# Patient Record
Sex: Female | Born: 1965 | Race: Black or African American | Hispanic: No | Marital: Married | Smoking: Never smoker
Health system: Southern US, Community
[De-identification: ages and names within clinical notes are randomized; demographics above are authoritative.]

## PROBLEM LIST (undated history)

## (undated) DIAGNOSIS — E079 Disorder of thyroid, unspecified: Secondary | ICD-10-CM

---
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
## 1992-10-30 HISTORY — PX: TUBAL LIGATION: SHX77

## 2016-02-15 ENCOUNTER — Ambulatory Visit (INDEPENDENT_AMBULATORY_CARE_PROVIDER_SITE_OTHER): Payer: Self-pay | Admitting: Internal Medicine

## 2016-02-15 ENCOUNTER — Encounter: Payer: Self-pay | Admitting: Internal Medicine

## 2016-02-15 VITALS — BP 128/78 | HR 72 | Resp 18 | Ht 67.0 in | Wt 173.0 lb

## 2016-02-15 DIAGNOSIS — D179 Benign lipomatous neoplasm, unspecified: Secondary | ICD-10-CM

## 2016-02-15 DIAGNOSIS — G5620 Lesion of ulnar nerve, unspecified upper limb: Secondary | ICD-10-CM

## 2016-02-15 DIAGNOSIS — K029 Dental caries, unspecified: Secondary | ICD-10-CM

## 2016-02-15 DIAGNOSIS — Z609 Problem related to social environment, unspecified: Secondary | ICD-10-CM

## 2016-02-15 DIAGNOSIS — Z659 Problem related to unspecified psychosocial circumstances: Secondary | ICD-10-CM

## 2016-02-15 NOTE — Patient Instructions (Addendum)
Can google "advance directives, [place]"  And bring up form from Secretary of [place]. Print and fill out Or can go to "5 wishes"  Which is also in Spanish and fill out--this costs $5--perhaps easier to use. Designate a Forensic scientist to speak for you if you are unable to speak for yourself when ill or injured  Please call clinic and let us know whether you can get hold of the prescription for lenses from last eye appt.  If not, will refer to Optometry and not just for glasses.

## 2016-02-15 NOTE — Progress Notes (Signed)
   Subjective:    Patient ID: Rachel Baxter, female    DOB: 08-19-1966, 50 y.o.   MRN: [id number]  HPI   New patient to establish and ultimately get a CPE  1. Lump in left forearm:  Noted this about 3 years ago.  Seems to get smaller at times.  States had a ganglion cyst more distally in her 66s, but not clear if that resolved on own.   Does get numbness on ulnar or opposite side of bilateral hand and arm--positional and occurs with elbow bent with pressure on elbow.  Can shake out arms and resolves.  No current outpatient prescriptions on file.   Allergies  Allergen Reactions  . Erythromycin Nausea And Vomiting   No past medical history on file.   Past Surgical History  Procedure Laterality Date  . Dilation and curettage of uterus  1985    After delivery of baby--sounds like retained placenta  . Tubal ligation  1994   Social History   Social History  . Marital Status: Married    Spouse Name: Separated  . Number of Children: 2  . Years of Education: 12   Occupational History  . [employer]     [employer]   Social History Main Topics  . Smoking status: Never Smoker   . Smokeless tobacco: Never Used  . Alcohol Use: No  . Drug Use: No  . Sexual Activity: Not Currently   Other Topics Concern  . Not on file   Social History Narrative   Moved to [place] 3 years ago after living in [place] for 17 years.    Originally from [place] with son and grandson.   Daughter's son has lived with her permanently since age 67 yo.  He is now 32 yo.   Daughter is now pregnant and intermittently lives with her.  Daughter does not live a stable life.    Family History  Problem Relation Age of Onset  . Diabetes Mother   . Heart disease Mother     CHF  . Pancreatitis Mother   . Alcohol abuse Mother 68    due to chronic illness of a daughter  . Heart disease Daughter   . Dementia Paternal Grandmother     from head injury      Review of Systems     Objective:    Physical Exam NAD HEENT: Dental decay Lungs:  CTA CV:  RRR without murmur or rub, radial pulses normal and equal. Neuro:  Negative Tinel's over bilateral ulnar nerves at medial epitrochlear area.  No atrophy of hypothenar eminence.  Normal grip, normal sensation of 3rd, 4th, and 5th fingers bilaterally. 3 cm soft tissue mass under skin layer of left distal forearm.  No overlying erythema, no tenderness.  Easily moveable.         Assessment & Plan:  1.  Lipoma, left forearm.  Follow for now.  2.  Intermittent ulnar nerve compression:  Discussed avoidance of leaning of medial elbows.  3.  Dental Decay:  Dental referral  4.  Difficulties in relationship with daughter--referral to Macie Burows, LCSW    Schedule CPE

## 2016-02-16 ENCOUNTER — Telehealth: Payer: Self-pay | Admitting: Internal Medicine

## 2016-02-16 NOTE — Telephone Encounter (Signed)
Patient called to inform Dr. Amil Amen she was able to contact the eye doctor that performed the eye exam last year and was told that they will check the date exam was performed and will let her know if she needs a new exam or if she can just go pick up a pair of glasses. Patient to call back when she hears back from eye doctor

## 2016-02-17 ENCOUNTER — Encounter: Payer: Self-pay | Admitting: Internal Medicine

## 2016-02-22 ENCOUNTER — Telehealth: Payer: Self-pay | Admitting: Internal Medicine

## 2016-02-22 DIAGNOSIS — H547 Unspecified visual loss: Secondary | ICD-10-CM

## 2016-02-22 NOTE — Telephone Encounter (Signed)
Patient called to inform Dr. Amil Amen that she has not been able to reach previous physician who performed eye exam.  Would like for Dr. Amil Amen to place a referral for her to receive an eye exam through [facility].  Patient can be reached at [phone].

## 2016-02-24 NOTE — Telephone Encounter (Signed)
Left voice message to return call 

## 2016-02-25 NOTE — Telephone Encounter (Signed)
Patient referred to eye doctor by Dr. Amil Amen through [place].

## 2016-03-01 ENCOUNTER — Ambulatory Visit (INDEPENDENT_AMBULATORY_CARE_PROVIDER_SITE_OTHER): Payer: Self-pay | Admitting: Licensed Clinical Social Worker

## 2016-03-01 DIAGNOSIS — Z609 Problem related to social environment, unspecified: Secondary | ICD-10-CM

## 2016-03-01 DIAGNOSIS — Z659 Problem related to unspecified psychosocial circumstances: Secondary | ICD-10-CM

## 2016-03-01 NOTE — Telephone Encounter (Signed)
Left voice message to return call 
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
## 2016-03-15 ENCOUNTER — Other Ambulatory Visit: Payer: No Typology Code available for payment source | Admitting: Licensed Clinical Social Worker

## 2016-03-29 ENCOUNTER — Ambulatory Visit (INDEPENDENT_AMBULATORY_CARE_PROVIDER_SITE_OTHER): Payer: Self-pay | Admitting: Licensed Clinical Social Worker

## 2016-03-29 DIAGNOSIS — F4321 Adjustment disorder with depressed mood: Secondary | ICD-10-CM
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
## 2016-04-12 ENCOUNTER — Other Ambulatory Visit: Payer: No Typology Code available for payment source | Admitting: Licensed Clinical Social Worker

## 2016-05-03 ENCOUNTER — Ambulatory Visit (INDEPENDENT_AMBULATORY_CARE_PROVIDER_SITE_OTHER): Payer: Self-pay | Admitting: Licensed Clinical Social Worker

## 2016-05-03 DIAGNOSIS — F418 Other specified anxiety disorders: Secondary | ICD-10-CM
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
## 2016-05-16 ENCOUNTER — Ambulatory Visit (INDEPENDENT_AMBULATORY_CARE_PROVIDER_SITE_OTHER): Payer: Self-pay | Admitting: Internal Medicine

## 2016-05-16 ENCOUNTER — Encounter: Payer: Self-pay | Admitting: Internal Medicine

## 2016-05-16 ENCOUNTER — Ambulatory Visit (INDEPENDENT_AMBULATORY_CARE_PROVIDER_SITE_OTHER): Payer: Self-pay | Admitting: Licensed Clinical Social Worker

## 2016-05-16 VITALS — BP 118/60 | HR 68 | Resp 18 | Ht 67.0 in | Wt 200.0 lb

## 2016-05-16 DIAGNOSIS — I00 Rheumatic fever without heart involvement: Secondary | ICD-10-CM | POA: Insufficient documentation

## 2016-05-16 DIAGNOSIS — Z13220 Encounter for screening for lipoid disorders: Secondary | ICD-10-CM

## 2016-05-16 DIAGNOSIS — M542 Cervicalgia: Secondary | ICD-10-CM

## 2016-05-16 DIAGNOSIS — Z0000 Encounter for general adult medical examination without abnormal findings: Secondary | ICD-10-CM

## 2016-05-16 DIAGNOSIS — F418 Other specified anxiety disorders: Secondary | ICD-10-CM

## 2016-05-16 DIAGNOSIS — Z23 Encounter for immunization: Secondary | ICD-10-CM

## 2016-05-16 DIAGNOSIS — Z1239 Encounter for other screening for malignant neoplasm of breast: Secondary | ICD-10-CM

## 2016-05-16 DIAGNOSIS — A5901 Trichomonal vulvovaginitis: Secondary | ICD-10-CM

## 2016-05-16 DIAGNOSIS — Z124 Encounter for screening for malignant neoplasm of cervix: Secondary | ICD-10-CM

## 2016-05-16 DIAGNOSIS — E042 Nontoxic multinodular goiter: Secondary | ICD-10-CM

## 2016-05-16 LAB — POCT WET PREP WITH KOH
Clue Cells Wet Prep HPF POC: NEGATIVE
KOH Prep POC: NEGATIVE
RBC WET PREP PER HPF POC: NEGATIVE
Trichomonas, UA: POSITIVE
Yeast Wet Prep HPF POC: NEGATIVE

## 2016-05-16 MED ORDER — METRONIDAZOLE 500 MG PO TABS: ORAL_TABLET | ORAL | Status: DC

## 2016-05-16 MED ORDER — CYCLOBENZAPRINE HCL 5 MG PO TABS: ORAL_TABLET | ORAL | Status: DC

## 2016-05-16 NOTE — Progress Notes (Signed)
Subjective:    Patient ID: Rachel Baxter, female    DOB: 1966-04-26, 50 y.o.   MRN: [id number]  HPI   Here for CPE with Pap  1.  Last Pap:  More than 5 years ago in [place], [place].  Always normal.  2.  Last Mammogram:  In [place], [place].   States she was in her 86s.  Had some sort of skin lesion on her left breast.    3.  SBE:  Not consistent.  Discussed checking 1-2 weeks after period.  4.  Guaiac Cards:  Never.    5.  Colonoscopy:  Never  6.  Immunizations:  No tetanus in past 10 years.   Never had flu vaccine.   Current outpatient prescriptions:  .  vitamin B-12 (CYANOCOBALAMIN) 500 MCG tablet, Take 500 mcg by mouth daily., Disp: , Rfl:    Allergies  Allergen Reactions  . Erythromycin Nausea And Vomiting   No past medical history on file.   Past Surgical History  Procedure Laterality Date  . Dilation and curettage of uterus  1985    After delivery of baby--sounds like retained placenta  . Tubal ligation  1994   Family History  Problem Relation Age of Onset  . Diabetes Mother   . Heart disease Mother     CHF  . Pancreatitis Mother   . Alcohol abuse Mother 70    due to chronic illness of a daughter  . Heart disease Daughter   . Dementia Paternal Grandmother     from head injury     Review of Systems  Constitutional: Negative for appetite change.       Does not like the heat.  "sucks her energy away"  HENT: Negative for trouble swallowing.        When went to dentist last week.  10 cavities:  2 filled.  To have cleaning next week and will have other cavities addressed thereafter.  Eyes: Positive for visual disturbance.       Has not received notice of eye referral yet.  Near vision is blurry.  Current glasses not adequate  Respiratory: Negative for cough and shortness of breath.   Cardiovascular: Positive for leg swelling. Negative for chest pain and palpitations.       Left ankle swells when standing on concrete floor at work.  Gastrointestinal: Negative for  nausea, vomiting, abdominal pain, diarrhea, constipation and blood in stool.       No melena  Endocrine: Negative for polydipsia and polyuria.  Genitourinary: Negative for dysuria, hematuria, vaginal discharge and menstrual problem.       No history of STD  Musculoskeletal: Positive for neck pain.       Right posterior neck pain.  Felt her neck pop in this area a 5-6 days ago.  Has had some pain in neck and headache intermittently since then.  Was just sitting in her car before work when she turned her head and felt the pop. No numbness, tingling, or weakness of her right arm.   Has had neck popping before with neck pain, but not with headache.   Has taken Tylenol, which helps a little.   Neurological: Positive for headaches. Negative for tremors, weakness and numbness.  Hematological: Negative for adenopathy. Does not bruise/bleed easily.  Psychiatric/Behavioral: Negative for suicidal ideas and dysphoric mood. The patient is not nervous/anxious.        Objective:   Physical Exam  Constitutional: She is oriented to person, place, and time. She appears  well-developed and well-nourished.  HENT:  Head: Normocephalic and atraumatic.  Right Ear: Hearing, tympanic membrane, external ear and ear canal normal.  Left Ear: Hearing, tympanic membrane, external ear and ear canal normal.  Nose: Nose normal.  Mouth/Throat: Uvula is midline, oropharynx is clear and moist and mucous membranes are normal. Dental caries present.  Eyes: Conjunctivae and EOM are normal. Pupils are equal, round, and reactive to light.  Discs sharp bilaterally  Neck: Normal range of motion. Neck supple. Muscular tenderness present. Thyromegaly present.  Large nodule palpated on left lower thyroid lobe and smaller nodule on right lower lobe  Bilateral traps with tenderness out to shoulder and less so up to nuchal ridge.  Also tender down to medial scapula.  Left trap with mild swelling at medial aspect halfway to shoulder.    Cardiovascular: Normal rate, regular rhythm, S1 normal and S2 normal.  Exam reveals no S3, no S4 and no friction rub.   No murmur heard. No carotid bruit.  Carotid, radial, femoral, DP and PT pulses normal and equal.  Pulmonary/Chest: Effort normal and breath sounds normal. Right breast exhibits no inverted nipple, no mass, no nipple discharge and no tenderness. Left breast exhibits no inverted nipple, no mass, no nipple discharge and no tenderness.  Mild lumpiness without discreet mass in upper outer quadrants of bilateral breasts  Abdominal: Soft. Bowel sounds are normal. She exhibits no mass. There is no hepatosplenomegaly. There is no tenderness. No hernia.  Genitourinary: Uterus normal. Rectal exam shows no mass and no tenderness. Guaiac negative stool. There is no rash, tenderness or lesion on the right labia. There is no rash, tenderness or lesion on the left labia. Cervix exhibits discharge and friability. Cervix exhibits no motion tenderness. Right adnexum displays no mass and no tenderness. Left adnexum displays no mass and no tenderness.  Frothy beige vaginal discharge with fishy odor-copious.  Cervix with mild friability.  Musculoskeletal: Normal range of motion.  Lymphadenopathy:    She has no cervical adenopathy.    She has no axillary adenopathy.       Right: No inguinal adenopathy present.       Left: No inguinal adenopathy present.  Neurological: She is alert and oriented to person, place, and time. She has normal strength and normal reflexes. No cranial nerve deficit or sensory deficit. Coordination and gait normal.  Skin: Skin is warm and dry. No lesion and no rash noted. Nails show no clubbing.  Psychiatric: She has a normal mood and affect. Her speech is normal and behavior is normal. Judgment and thought content normal.          Assessment & Plan:  1.  CPE with pap Tdap today. Fasting labs:  FLP, CBC, CMP.  Additional labs below Mammogram through  scholarship. Guaiac Cards x 3 to return in 2 weeks.  2.  Multinodular Goiter:  Suspect will require FNA.  Check Thyroid ultrasound, TSH  3.  Trichomonas Vaginitis:  GC/Chlamydia swab,RPR,  HIV as well.  Metronidazole 2,000 mg one dose.  Boyfriend to be evaluated and treated and they need discuss their relationship.  4.  Muscle spasm, bilateral trapezius:  Warm pack with stretches and Ibuprofen as needed.  To call if no improvement in next 1-2 weeks and will refer to PT.

## 2016-05-16 NOTE — Progress Notes (Signed)
   THERAPY PROGRESS NOTE  Session Time: 12min  Participation Level: Active  Behavioral Response: Neat and Well GroomedAlertEuthymic  Type of Therapy: Individual Therapy  Treatment Goals addressed: Coping  Interventions: Supportive  Summary: Rachel Baxter is a 50 y.o. female who presents with a positive mood and appropriate affect. She reported that she has a new grandson who was born yesterday. She expressed her happiness and excitement, despite her difficult relationship with her daughter. She stated that she "will not be raising that baby." [name] reported that she also has a new puppy, which brings its own stressors and joys. She shared about some of her relationships with co-workers. She reported that she still has not been hired on as full-time, despite having finished her 20 day probation period. She shared her frustration, as she is now working for less money than most of her co-workers. [name] reported that she had had just one panic attack in the past two weeks, possibly triggered by extreme heat. She shared that getting overheated also makes her angry and irritated.    Suicidal/Homicidal: Nowithout intent/plan  Therapist Response: LCSW utilized supportive counseling techniques throughout the session in order to validate emotions and encourage open expression of emotion. LCSW and [name] processed about her current stressors.  Plan: Return again in 3 weeks.  Diagnosis: Axis I: See current hospital problem list    Axis II: No diagnosis    Metta Clines, LCSW 05/16/2016

## 2016-05-16 NOTE — Patient Instructions (Addendum)
Call if vaginal discharge does not resolve  Ibuprofen 200 mg 2-4 tabs twice daily for neck pain with food for next 2 weeks, then only as needed thereafter.  Warm pack neck and stretch twice daily  Ames walker thigh high compression stockings will be least expensive.

## 2016-05-17 LAB — COMPREHENSIVE METABOLIC PANEL
A/G RATIO: 1.3 (ref 1.2–2.2)
ALK PHOS: 51 IU/L (ref 39–117)
ALT: 20 IU/L (ref 0–32)
AST: 17 IU/L (ref 0–40)
Albumin: 4 g/dL (ref 3.5–5.5)
BUN/Creatinine Ratio: 13 (ref 9–23)
BUN: 10 mg/dL (ref 6–24)
Bilirubin Total: 0.9 mg/dL (ref 0.0–1.2)
CO2: 29 mmol/L (ref 18–29)
Calcium: 9 mg/dL (ref 8.7–10.2)
Chloride: 104 mmol/L (ref 96–106)
Creatinine, Ser: 0.78 mg/dL (ref 0.57–1.00)
GFR calc Af Amer: 104 mL/min/{1.73_m2} (ref >59)
GFR calc non Af Amer: 90 mL/min/{1.73_m2} (ref >59)
GLOBULIN, TOTAL: 3.1 g/dL (ref 1.5–4.5)
Glucose: 91 mg/dL (ref 65–99)
POTASSIUM: 4.2 mmol/L (ref 3.5–5.2)
SODIUM: 143 mmol/L (ref 134–144)
Total Protein: 7.1 g/dL (ref 6.0–8.5)

## 2016-05-17 LAB — CBC WITH DIFFERENTIAL/PLATELET
BASOS ABS: 0 10*3/uL (ref 0.0–0.2)
Basos: 0 %
EOS (ABSOLUTE): 0.1 10*3/uL (ref 0.0–0.4)
Eos: 2 %
Hematocrit: 42.5 % (ref 34.0–46.6)
Hemoglobin: 13.9 g/dL (ref 11.1–15.9)
IMMATURE GRANS (ABS): 0 10*3/uL (ref 0.0–0.1)
IMMATURE GRANULOCYTES: 0 %
LYMPHS: 43 %
Lymphocytes Absolute: 1.6 10*3/uL (ref 0.7–3.1)
MCH: 30 pg (ref 26.6–33.0)
MCHC: 32.7 g/dL (ref 31.5–35.7)
MCV: 92 fL (ref 79–97)
MONOS ABS: 0.3 10*3/uL (ref 0.1–0.9)
Monocytes: 8 %
NEUTROS PCT: 47 %
Neutrophils Absolute: 1.8 10*3/uL (ref 1.4–7.0)
PLATELETS: 263 10*3/uL (ref 150–379)
RBC: 4.63 x10E6/uL (ref 3.77–5.28)
RDW: 13.2 % (ref 12.3–15.4)
WBC: 3.7 10*3/uL (ref 3.4–10.8)

## 2016-05-17 LAB — RPR QUALITATIVE: RPR: NONREACTIVE

## 2016-05-17 LAB — TSH: TSH: 1.78 u[IU]/mL (ref 0.450–4.500)

## 2016-05-17 LAB — HIV ANTIBODY (ROUTINE TESTING W REFLEX): HIV Screen 4th Generation wRfx: NONREACTIVE

## 2016-05-17 LAB — LIPID PANEL W/O CHOL/HDL RATIO
CHOLESTEROL TOTAL: 155 mg/dL (ref 100–199)
HDL: 61 mg/dL (ref >39)
LDL Calculated: 82 mg/dL (ref 0–99)
TRIGLYCERIDES: 59 mg/dL (ref 0–149)
VLDL Cholesterol Cal: 12 mg/dL (ref 5–40)

## 2016-05-18 LAB — CYTOLOGY - PAP

## 2016-05-18 LAB — GC/CHLAMYDIA PROBE AMP
Chlamydia trachomatis, NAA: NEGATIVE
Neisseria gonorrhoeae by PCR: NEGATIVE

## 2016-06-14 ENCOUNTER — Other Ambulatory Visit: Payer: No Typology Code available for payment source | Admitting: Licensed Clinical Social Worker

## 2018-05-13 ENCOUNTER — Encounter (HOSPITAL_COMMUNITY): Payer: Self-pay | Admitting: Emergency Medicine

## 2018-05-13 ENCOUNTER — Other Ambulatory Visit: Payer: Self-pay

## 2018-05-13 ENCOUNTER — Emergency Department (HOSPITAL_COMMUNITY)
Admission: EM | Admit: 2018-05-13 | Discharge: 2018-05-13 | Disposition: A | Discharge status: Home / Self Care | Payer: Self-pay | Attending: Emergency Medicine | Admitting: Emergency Medicine

## 2018-05-13 DIAGNOSIS — X58XXXA Exposure to other specified factors, initial encounter: Secondary | ICD-10-CM | POA: Insufficient documentation

## 2018-05-13 DIAGNOSIS — M545 Low back pain, unspecified: Secondary | ICD-10-CM

## 2018-05-13 DIAGNOSIS — S39012A Strain of muscle, fascia and tendon of lower back, initial encounter: Secondary | ICD-10-CM | POA: Insufficient documentation

## 2018-05-13 DIAGNOSIS — Z79899 Other long term (current) drug therapy: Secondary | ICD-10-CM | POA: Insufficient documentation

## 2018-05-13 DIAGNOSIS — Y999 Unspecified external cause status: Secondary | ICD-10-CM | POA: Insufficient documentation

## 2018-05-13 DIAGNOSIS — Y939 Activity, unspecified: Secondary | ICD-10-CM | POA: Insufficient documentation

## 2018-05-13 DIAGNOSIS — Y929 Unspecified place or not applicable: Secondary | ICD-10-CM | POA: Insufficient documentation

## 2018-05-13 MED ORDER — DIAZEPAM 5 MG PO TABS
5.0000 mg | ORAL_TABLET | Freq: Once | ORAL | Status: DC
  Filled 2018-05-13: qty 1

## 2018-05-13 MED ORDER — KETOROLAC TROMETHAMINE 15 MG/ML IJ SOLN
15.0000 mg | Freq: Once | INTRAMUSCULAR | Status: AC
  Administered 2018-05-13: 15 mg via INTRAMUSCULAR
  Filled 2018-05-13: qty 1

## 2018-05-13 MED ORDER — ACETAMINOPHEN 500 MG PO TABS
1000.0000 mg | ORAL_TABLET | Freq: Once | ORAL | Status: AC
  Administered 2018-05-13: 1000 mg via ORAL
  Filled 2018-05-13: qty 2

## 2018-05-13 MED ORDER — OXYCODONE HCL 5 MG PO TABS
5.0000 mg | ORAL_TABLET | Freq: Once | ORAL | Status: DC
  Filled 2018-05-13: qty 1

## 2018-05-13 NOTE — ED Triage Notes (Signed)
Pt states she has had left lower back pain to middle of back that is worse with movement and standing for 2 weeks. Pt states she had a hard time getting out of bed. Pt ambulatory at triage.

## 2018-05-13 NOTE — ED Provider Notes (Signed)
[facility] Provider Note   CSN: [id number] Arrival date & time: 05/13/18  0719   History   Chief Complaint Chief Complaint  Patient presents with  . Back Pain    HPI Rachel Baxter is a 52 y.o. female with no significant past medical history who presents to the ED for a 2 week history of non-radiating left lower back pain. She does not remember any inciting events for the back pain, rather the pain came on gradually and has worsened over the past 2 weeks. The pain is constant and is currently 8/10. She states that she lifts heavy boxes at work and has been performing her work duties as normal. The pain is worsened by moving and bending at the waist. She has tried tylenol and ibuprofen without relief. She denies fevers, chills, groin numbness, bowel or bladder incontinence, injection drug use, dysuria, and vaginal discharge.  History reviewed. No pertinent past medical history.  Patient Active Problem List   Diagnosis Date Noted  . Rheumatic fever 05/16/2016    OB History   None      Home Medications    Prior to Admission medications   Medication Sig Start Date End Date Taking? Authorizing Provider  cyclobenzaprine (FLEXERIL) 5 MG tablet 1 tab by mouth at bedtime for neck pain 05/16/16   Mack Hook, MD  metroNIDAZOLE (FLAGYL) 500 MG tablet 4 tabs by mouth for 1 dose. 05/16/16   Mack Hook, MD  vitamin B-12 (CYANOCOBALAMIN) 500 MCG tablet Take 500 mcg by mouth daily.    [provider]    Family History Family History  Problem Relation Age of Onset  . Diabetes Mother   . Heart disease Mother        CHF  . Pancreatitis Mother   . Alcohol abuse Mother 5       due to chronic illness of a daughter  . Heart disease Daughter   . Dementia Paternal Grandmother        from head injury    Social History Social History   Tobacco Use  . Smoking status: Never Smoker  . Smokeless tobacco: Never Used  Substance  Use Topics  . Alcohol use: No    Alcohol/week: 0.0 oz  . Drug use: No     Allergies   Erythromycin   Review of Systems Review of Systems  Constitutional: Negative for chills and fever.  HENT: Negative for rhinorrhea and sore throat.   Eyes: Negative for visual disturbance.  Respiratory: Negative for cough and shortness of breath.   Cardiovascular: Negative for chest pain and leg swelling.  Gastrointestinal: Negative for abdominal pain, constipation, diarrhea, nausea and vomiting.  Genitourinary: Negative for difficulty urinating, dysuria, flank pain, vaginal bleeding, vaginal discharge and vaginal pain.  Musculoskeletal: Positive for back pain. Negative for neck pain.  Skin: Negative for rash and wound.  Neurological: Negative for weakness and headaches.     Physical Exam Updated Vital Signs BP (!) 161/74 (BP Location: Right Arm)   Pulse 62   Temp 98.6 F (37 C) (Oral)   Resp 16   LMP 04/29/2018   SpO2 99%   Physical Exam  Constitutional: She is oriented to person, place, and time. She appears well-developed and well-nourished. No distress.  HENT:  Head: Normocephalic and atraumatic.  Eyes: Pupils are equal, round, and reactive to light. EOM are normal.  Neck: Normal range of motion. Neck supple.  Cardiovascular: Normal rate and regular rhythm. Exam reveals no gallop and  no friction rub.  No murmur heard. Pulmonary/Chest: Effort normal and breath sounds normal. No respiratory distress. She has no wheezes. She has no rales.  Abdominal: Soft. Bowel sounds are normal. She exhibits no distension. There is no tenderness.  Musculoskeletal: She exhibits no edema or deformity.  No vertebral tenderness. Mild tenderness to palpation over the left lower paraspinal muscles. No CVA tenderness. Full ROM with normal gait, but walking, bending at the waist, and twisting at the waist worsen her pain. Pt unable to left left leg straight off the bed due to pain in her back.     Neurological: She is alert and oriented to person, place, and time. No sensory deficit.  Skin: Skin is warm and dry. Capillary refill takes less than 2 seconds. No rash noted.  Psychiatric: She has a normal mood and affect. Her behavior is normal.     ED Treatments / Results  Labs (all labs ordered are listed, but only abnormal results are displayed) Labs Reviewed - No data to display  EKG None  Radiology No results found.  Procedures Procedures (including critical care time)  Medications Ordered in ED Medications  oxyCODONE (Oxy IR/ROXICODONE) immediate release tablet 5 mg (0 mg Oral Hold 05/13/18 0836)  diazepam (VALIUM) tablet 5 mg (0 mg Oral Hold 05/13/18 0836)  acetaminophen (TYLENOL) tablet 1,000 mg (1,000 mg Oral Given 05/13/18 0826)  ketorolac (TORADOL) 15 MG/ML injection 15 mg (15 mg Intramuscular Given 05/13/18 0826)     Initial Impression / Assessment and Plan / ED Course  I have reviewed the triage vital signs and the nursing notes.  Pertinent labs & imaging results that were available during my care of the patient were reviewed by me and considered in my medical decision making (see chart for details).  Rachel Baxter is a 52 y.o. female with no significant past medical history who presents to the ED for a 2 week history of non-radiating left lower back pain in the setting of performing heavy lifting regularly at work. Upon arrival to the ED, pt is afebrile and hemodynamically stable. Physical exam is significant for tenderness of the left lower back. No red flag signs or symptoms are present. Pain is likely caused by a muscular sprain to the left paraspinal muscles. Patient treated for pain with tylenol and IM toradol. Pt deemed medically safe for discharge home with return precautions of fever, worsening back pain, saddle anesthesia, and incontinence. Recommended patient alternate ibuprofen and tylenol around the clock as needed for pain and f/u with PCP for possible PT  referral if pain does not resolve. Provided work letter for limited activities without lifting heavy objects, bending at the waist, or twisting at the waist for the next 7 days.  Final Clinical Impressions(s) / ED Diagnoses   Final diagnoses:  Left-sided low back pain without sciatica, unspecified chronicity  Strain of lumbar region, initial encounter    ED Discharge Orders    None       Rachel Baxter, Andree Elk, MD 05/13/18 [place], [place], DO 05/13/18 1148

## 2018-05-13 NOTE — Discharge Instructions (Addendum)
Based on your symptoms and physical exam, your back pain is likely due to a muscle strain. The most effective medications for this type of pain are tylenol and ibuprofen. Alternate between these two medications every 4 hours. Take either 800mg  ibuprofen or 1,000mg  tylenol every four hours. You can buy these medications over the counter at any pharmacy. Because your work involves heavy lifting, you are at risk for re-injuring your back or not allowing it to heal. I've provided you with a work note that exempts you from lifting anything over 10 pounds, bending at the waist, or twisting at the waist for the next 7 days. Try to stay active with normal activity without exerting yourself or straining your back. Please f/u with your PCP. If the back pain doesn't resolve, your PCP may refer you to a physical therapist.

## 2018-11-05 ENCOUNTER — Encounter (HOSPITAL_COMMUNITY): Payer: Self-pay | Admitting: Emergency Medicine

## 2018-11-05 ENCOUNTER — Other Ambulatory Visit: Payer: Self-pay

## 2018-11-05 ENCOUNTER — Ambulatory Visit (HOSPITAL_COMMUNITY)
Admission: EM | Admit: 2018-11-05 | Discharge: 2018-11-05 | Disposition: A | Discharge status: Home / Self Care | Payer: PRIVATE HEALTH INSURANCE | Attending: Family Medicine | Admitting: Family Medicine

## 2018-11-05 DIAGNOSIS — B9789 Other viral agents as the cause of diseases classified elsewhere: Secondary | ICD-10-CM | POA: Diagnosis not present

## 2018-11-05 DIAGNOSIS — J069 Acute upper respiratory infection, unspecified: Secondary | ICD-10-CM | POA: Diagnosis not present

## 2018-11-05 MED ORDER — DM-GUAIFENESIN ER 30-600 MG PO TB12: 1.0000 | ORAL_TABLET | Freq: Two times a day (BID) | ORAL | 0 refills | Status: DC

## 2018-11-05 MED ORDER — BENZONATATE 200 MG PO CAPS: 200.0000 mg | ORAL_CAPSULE | Freq: Two times a day (BID) | ORAL | 0 refills | Status: DC | PRN

## 2018-11-05 NOTE — ED Triage Notes (Signed)
PT reports cough, congestion, SOB that started Sunday. PT quit smoking on Jan. 1

## 2018-11-05 NOTE — Discharge Instructions (Addendum)
Rest and push fluids. Use a HUMIDIFIER if you have one. Take the Tessalon and the Mucinex 2 times a day This will help with the cough and congestion I expect to see improvement over the next several days

## 2018-11-05 NOTE — ED Provider Notes (Signed)
[facility]    CSN: [id number] Arrival date & time: 11/05/18  1637     History   Chief Complaint Chief Complaint  Patient presents with  . URI    HPI Rachel Baxter is a 53 y.o. female.   HPI  Patient has had a cough and cold for the last 4 days.  She feels like she has some chest congestion.  Not time feels short of breath.  She is coughing up some mucus.  The cough is worse at night.  She is had no significant fever or chills.  No body aches.  Mild fatigue.  She has continued working.  She has no sinus congestion, runny stuffy nose, or sore throat.  No underlying asthma or COPD.  She is generally in good health and on no medications History reviewed. No pertinent past medical history.  Patient Active Problem List   Diagnosis Date Noted  . Rheumatic fever 05/16/2016    Past Surgical History:  Procedure Laterality Date  . [place]   After delivery of baby--sounds like retained placenta  . TUBAL LIGATION  1994    OB History   No obstetric history on file.      Home Medications    Prior to Admission medications   Medication Sig Start Date End Date Taking? Authorizing Provider  benzonatate (TESSALON) 200 MG capsule Take 1 capsule (200 mg total) by mouth 2 (two) times daily as needed for cough. 11/05/18   Raylene Everts, MD  cyclobenzaprine (FLEXERIL) 5 MG tablet 1 tab by mouth at bedtime for neck pain 05/16/16   Mack Hook, MD  dextromethorphan-guaiFENesin [facility] DM) 30-600 MG 12hr tablet Take 1 tablet by mouth 2 (two) times daily. 11/05/18   Raylene Everts, MD  vitamin B-12 (CYANOCOBALAMIN) 500 MCG tablet Take 500 mcg by mouth daily.    [provider]    Family History Family History  Problem Relation Age of Onset  . Diabetes Mother   . Heart disease Mother        CHF  . Pancreatitis Mother   . Alcohol abuse Mother 83       due to chronic illness of a daughter  . Heart disease Daughter   .  Dementia Paternal Grandmother        from head injury    Social History Social History   Tobacco Use  . Smoking status: Never Smoker  . Smokeless tobacco: Never Used  Substance Use Topics  . Alcohol use: No    Alcohol/week: 0.0 standard drinks  . Drug use: No     Allergies   Erythromycin   Review of Systems Review of Systems  Constitutional: Negative for chills and fever.  HENT: Negative for ear pain and sore throat.   Eyes: Negative for pain and visual disturbance.  Respiratory: Positive for cough and chest tightness. Negative for shortness of breath.   Cardiovascular: Negative for chest pain and palpitations.  Gastrointestinal: Negative for abdominal pain and vomiting.  Genitourinary: Negative for dysuria and hematuria.  Musculoskeletal: Negative for arthralgias and back pain.  Skin: Negative for color change and rash.  Neurological: Negative for seizures and syncope.  All other systems reviewed and are negative.    Physical Exam Triage Vital Signs ED Triage Vitals  Enc Vitals Group     BP 11/05/18 1711 121/80     Pulse --      Resp --      Temp --  Temp src --      SpO2 --      Weight --      Height --      Head Circumference --      Peak Flow --      Pain Score 11/05/18 1710 3     Pain Loc --      Pain Edu? --      Excl. in [place]? --    No data found.  Updated Vital Signs BP 121/80      Physical Exam Constitutional:      General: She is not in acute distress.    Appearance: She is well-developed and normal weight.  HENT:     Head: Normocephalic and atraumatic.     Right Ear: Tympanic membrane, ear canal and external ear normal.     Left Ear: Tympanic membrane, ear canal and external ear normal.     Nose: Nose normal. No congestion.     Mouth/Throat:     Mouth: Mucous membranes are moist.  Eyes:     Conjunctiva/sclera: Conjunctivae normal.     Pupils: Pupils are equal, round, and reactive to light.  Neck:     Musculoskeletal: Normal  range of motion.  Cardiovascular:     Rate and Rhythm: Normal rate and regular rhythm.     Pulses: Normal pulses.  Pulmonary:     Effort: Pulmonary effort is normal. No respiratory distress.     Comments: Some bronchial breath sounds.  No wheezing or rales Abdominal:     General: There is no distension.     Palpations: Abdomen is soft.  Musculoskeletal: Normal range of motion.  Lymphadenopathy:     Cervical: No cervical adenopathy.  Skin:    General: Skin is warm and dry.  Neurological:     General: No focal deficit present.     Mental Status: She is alert. Mental status is at baseline.      UC Treatments / Results  Labs (all labs ordered are listed, but only abnormal results are displayed) Labs Reviewed - No data to display  EKG None  Radiology No results found.  Procedures Procedures (including critical care time)  Medications Ordered in UC Medications - No data to display  Initial Impression / Assessment and Plan / UC Course  I have reviewed the triage vital signs and the nursing notes.  Pertinent labs & imaging results that were available during my care of the patient were reviewed by me and considered in my medical decision making (see chart for details).    I believe she has a viral bronchitis.  Told her she has a "chest cold".  Discussed symptomatic care.  Return if worse instead of better.  I did review with her that I do not believe that she would improve with antibiotics.  She is satisfied with this explanation. Final Clinical Impressions(s) / UC Diagnoses   Final diagnoses:  Viral URI with cough     Discharge Instructions     Rest and push fluids. Use a HUMIDIFIER if you have one. Take the Tessalon and the Mucinex 2 times a day This will help with the cough and congestion I expect to see improvement over the next several days   ED Prescriptions    Medication Sig Dispense Auth. Provider   benzonatate (TESSALON) 200 MG capsule Take 1 capsule  (200 mg total) by mouth 2 (two) times daily as needed for cough. 20 capsule Raylene Everts, MD   dextromethorphan-guaiFENesin [facility]  DM) 30-600 MG 12hr tablet Take 1 tablet by mouth 2 (two) times daily. 20 tablet Raylene Everts, MD     Controlled Substance Prescriptions [place] Controlled Substance Registry consulted? Not Applicable   Raylene Everts, MD 11/05/18 [id number]

## 2020-06-29 ENCOUNTER — Encounter (HOSPITAL_COMMUNITY): Payer: Self-pay | Admitting: Emergency Medicine

## 2020-06-29 ENCOUNTER — Ambulatory Visit (HOSPITAL_COMMUNITY)
Admission: EM | Admit: 2020-06-29 | Discharge: 2020-06-29 | Disposition: A | Discharge status: Home / Self Care | Payer: HRSA Program | Attending: Family Medicine | Admitting: Family Medicine

## 2020-06-29 DIAGNOSIS — Z791 Long term (current) use of non-steroidal anti-inflammatories (NSAID): Secondary | ICD-10-CM | POA: Insufficient documentation

## 2020-06-29 DIAGNOSIS — Z79899 Other long term (current) drug therapy: Secondary | ICD-10-CM | POA: Insufficient documentation

## 2020-06-29 DIAGNOSIS — Z881 Allergy status to other antibiotic agents status: Secondary | ICD-10-CM | POA: Insufficient documentation

## 2020-06-29 DIAGNOSIS — M542 Cervicalgia: Secondary | ICD-10-CM | POA: Insufficient documentation

## 2020-06-29 DIAGNOSIS — M791 Myalgia, unspecified site: Secondary | ICD-10-CM | POA: Diagnosis not present

## 2020-06-29 DIAGNOSIS — Z20822 Contact with and (suspected) exposure to covid-19: Secondary | ICD-10-CM | POA: Insufficient documentation

## 2020-06-29 MED ORDER — CYCLOBENZAPRINE HCL 10 MG PO TABS: 5.0000 mg | ORAL_TABLET | Freq: Every day | ORAL | 0 refills | Status: AC

## 2020-06-29 MED ORDER — IBUPROFEN 600 MG PO TABS: 600.0000 mg | ORAL_TABLET | Freq: Three times a day (TID) | ORAL | 0 refills | Status: DC | PRN

## 2020-06-29 NOTE — Discharge Instructions (Signed)
Medication as prescribed for symptoms. Gentle stretching, heat and massage. Covid swab pending Follow up as needed for continued or worsening symptoms

## 2020-06-29 NOTE — ED Triage Notes (Signed)
Pt presents with lower back pain, neck and shoulder pain xs 2 days. Pt denies any fall or injury.

## 2020-06-30 LAB — SARS CORONAVIRUS 2 (TAT 6-24 HRS): SARS Coronavirus 2: NEGATIVE

## 2020-06-30 NOTE — ED Provider Notes (Signed)
[place]    CSN: [id number] Arrival date & time: 06/29/20  1348      History   Chief Complaint Chief Complaint  Patient presents with  . Back Pain    HPI Michaelah Credeur is a 54 y.o. female.   Patient is a 54 year old female who presents today with upper back pain, neck pain, pain in shoulder blades.  This is been present for the past 2 days.  Does a lot of upper body movements and strenuous repetitive movements at work.  Denies any falls or injuries.  Denies any radiation of pain, numbness or tingling.     History reviewed. No pertinent past medical history.  Patient Active Problem List   Diagnosis Date Noted  . Rheumatic fever 05/16/2016    Past Surgical History:  Procedure Laterality Date  . [place]   After delivery of baby--sounds like retained placenta  . TUBAL LIGATION  1994    OB History   No obstetric history on file.      Home Medications    Prior to Admission medications   Medication Sig Start Date End Date Taking? Authorizing Provider  cyclobenzaprine (FLEXERIL) 10 MG tablet Take 0.5 tablets (5 mg total) by mouth at bedtime. 06/29/20   Loura Halt A, NP  ibuprofen (ADVIL) 600 MG tablet Take 1 tablet (600 mg total) by mouth every 8 (eight) hours as needed for moderate pain. 06/29/20   Loura Halt A, NP  vitamin B-12 (CYANOCOBALAMIN) 500 MCG tablet Take 500 mcg by mouth daily.    [provider]    Family History Family History  Problem Relation Age of Onset  . Diabetes Mother   . Heart disease Mother        CHF  . Pancreatitis Mother   . Alcohol abuse Mother 29       due to chronic illness of a daughter  . Heart disease Daughter   . Dementia Paternal Grandmother        from head injury    Social History Social History   Tobacco Use  . Smoking status: Never Smoker  . Smokeless tobacco: Never Used  Substance Use Topics  . Alcohol use: No    Alcohol/week: 0.0 standard drinks  . Drug  use: No     Allergies   Erythromycin   Review of Systems Review of Systems   Physical Exam Triage Vital Signs ED Triage Vitals  Enc Vitals Group     BP 06/29/20 1446 118/82     Pulse Rate 06/29/20 1446 89     Resp 06/29/20 1446 18     Temp 06/29/20 1446 99.1 F (37.3 C)     Temp Source 06/29/20 1446 Oral     SpO2 06/29/20 1446 96 %     Weight --      Height --      Head Circumference --      Peak Flow --      Pain Score 06/29/20 1444 6     Pain Loc --      Pain Edu? --      Excl. in [place]? --    No data found.  Updated Vital Signs BP 118/82 (BP Location: Right Arm)   Pulse 89   Temp 99.1 F (37.3 C) (Oral)   Resp 18   SpO2 96%   Visual Acuity Right Eye Distance:   Left Eye Distance:   Bilateral Distance:    Right Eye Near:  Left Eye Near:    Bilateral Near:     Physical Exam Vitals and nursing note reviewed.  Constitutional:      General: She is not in acute distress.    Appearance: Normal appearance. She is not ill-appearing, toxic-appearing or diaphoretic.  HENT:     Head: Normocephalic.     Nose: Nose normal.  Eyes:     Conjunctiva/sclera: Conjunctivae normal.  Pulmonary:     Effort: Pulmonary effort is normal.  Musculoskeletal:        General: Normal range of motion.     Cervical back: Normal range of motion.       Back:     Comments: TTP, no bony tenderness Normal ROM.   Skin:    General: Skin is warm and dry.     Findings: No rash.  Neurological:     Mental Status: She is alert.  Psychiatric:        Mood and Affect: Mood normal.      UC Treatments / Results  Labs (all labs ordered are listed, but only abnormal results are displayed) Labs Reviewed  SARS CORONAVIRUS 2 (TAT 6-24 HRS)    EKG   Radiology No results found.  Procedures Procedures (including critical care time)  Medications Ordered in UC Medications - No data to display  Initial Impression / Assessment and Plan / UC Course  I have reviewed the triage  vital signs and the nursing notes.  Pertinent labs & imaging results that were available during my care of the patient were reviewed by me and considered in my medical decision making (see chart for details).     Myalgia Muscle relaxant as needed.  Ibuprofen for pain as needed.  Rest, gentle stretching, heat and massage.  Follow up as needed for continued or worsening symptoms  Final Clinical Impressions(s) / UC Diagnoses   Final diagnoses:  Myalgia     Discharge Instructions     Medication as prescribed for symptoms. Gentle stretching, heat and massage. Covid swab pending Follow up as needed for continued or worsening symptoms     ED Prescriptions    Medication Sig Dispense Auth. Provider   cyclobenzaprine (FLEXERIL) 10 MG tablet Take 0.5 tablets (5 mg total) by mouth at bedtime. 20 tablet Cha Gomillion A, NP   ibuprofen (ADVIL) 600 MG tablet Take 1 tablet (600 mg total) by mouth every 8 (eight) hours as needed for moderate pain. 30 tablet Loura Halt A, NP     PDMP not reviewed this encounter.   Orvan July, NP 06/30/20 1047

## 2020-08-23 ENCOUNTER — Other Ambulatory Visit: Payer: Self-pay

## 2020-08-23 ENCOUNTER — Encounter (HOSPITAL_COMMUNITY): Payer: Self-pay | Admitting: *Deleted

## 2020-08-23 ENCOUNTER — Ambulatory Visit (HOSPITAL_COMMUNITY): Payer: 59

## 2020-08-23 ENCOUNTER — Ambulatory Visit (HOSPITAL_COMMUNITY)
Admission: EM | Admit: 2020-08-23 | Discharge: 2020-08-23 | Disposition: A | Discharge status: Home / Self Care | Payer: 59 | Attending: Family Medicine | Admitting: Family Medicine

## 2020-08-23 DIAGNOSIS — M5442 Lumbago with sciatica, left side: Secondary | ICD-10-CM | POA: Diagnosis not present

## 2020-08-23 MED ORDER — PREDNISONE 20 MG PO TABS: 40.0000 mg | ORAL_TABLET | Freq: Every day | ORAL | 0 refills | Status: AC

## 2020-08-23 MED ORDER — IBUPROFEN 600 MG PO TABS: 600.0000 mg | ORAL_TABLET | Freq: Three times a day (TID) | ORAL | 0 refills | Status: AC | PRN

## 2020-08-23 NOTE — ED Provider Notes (Signed)
[facility] [facility]   [id number] 08/23/20 Arrival Time: 1109  ASSESSMENT & PLAN:  1. Acute left-sided low back pain with left-sided sciatica     Able to ambulate here and hemodynamically stable. No indication for imaging of back at this time given no trauma and normal neurological exam. Discussed.   Meds ordered this encounter  Medications  . ibuprofen (ADVIL) 600 MG tablet    Sig: Take 1 tablet (600 mg total) by mouth every 8 (eight) hours as needed for moderate pain.    Dispense:  30 tablet    Refill:  0  . predniSONE (DELTASONE) 20 MG tablet    Sig: Take 2 tablets (40 mg total) by mouth daily.    Dispense:  10 tablet    Refill:  0    Medication sedation precautions given. Encourage ROM/movement as tolerated.  Recommend:  Follow-up Information    [place].   Why: If worsening or failing to improve as anticipated. Contact information: [address] [place] [phone]              Reviewed expectations re: course of current medical issues. Questions answered. Outlined signs and symptoms indicating need for more acute intervention. Patient verbalized understanding. After Visit Summary given.   SUBJECTIVE: History from: patient.  Rachel Baxter is a 54 y.o. female who presents with complaint of intermittent left sided lower back discomfort. Onset gradual. First noted over past several days. Injury/trama: no. History of back problems requiring medical care: frequent. Pain described as aching and with radiation to her left leg at times. Aggravating factors: certain movements and prolonged walking/standing. Alleviating factors: rest. Progressive LE weakness or saddle anesthesia: none. Extremity sensation changes or weakness: none. Ambulatory without difficulty. Normal bowel/bladder habits: yes; without urinary retention. Normal PO intake without n/v. No associated abdominal pain/n/v. Self  treatment: has tried occasional ibuprofen with some relief; pain does affect sleep at times.  Reports no chronic steroid use, fevers, IV drug use, or recent back surgeries or procedures.  ROS: As per HPI. All other systems negative.   OBJECTIVE:  Vitals:   08/23/20 1302 08/23/20 1305  BP: 122/73   Pulse: 85   Resp: 18   Temp: 98.2 F (36.8 C)   TempSrc: Oral   SpO2: 100%   Height:  5' 7.5" (1.715 m)    General appearance: alert; no distress HEENT: [place]; AT Neck: supple with FROM; without midline tenderness CV: regular Lungs: unlabored respirations; speaks full sentences without difficulty Abdomen: soft, non-tender; non-distended Back: mild  and poorly localized tenderness to palpation over upper left buttock; FROM at waist; bruising: none; without midline tenderness Extremities: without edema; symmetrical without gross deformities; normal ROM of bilateral LE Skin: warm and dry Neurologic: normal gait; normal sensation and strength and reflexes of bilateral LE Psychological: alert and cooperative; normal mood and affect   Allergies  Allergen Reactions  . Erythromycin Nausea And Vomiting    History reviewed. No pertinent past medical history. Social History   Socioeconomic History  . Marital status: Married    Spouse name: Separated  . Number of children: 2  . Years of education: 15  . Highest education level: Not on file  Occupational History  . 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 Smoking status: Never Smoker  . Smokeless tobacco: Never Used  Substance and Sexual Activity  . Alcohol use: No    Alcohol/week: 0.0 standard drinks  .  Drug use: No  . Sexual activity: Not Currently  Other Topics Concern  . Not on file  Social History Narrative   Moved to [place] 2014 after living in [place] for 17 years.    Originally from [place] with son and grandson.   Daughter's son has lived with her permanently since age 50 yo.   He is now 11 yo.   Daughter is now pregnant and intermittently lives with her.  Daughter does not live a stable life.   Getting counseling with Metta Clines, LCSW, which she feels is helpful.   States daughter is getting her life together somewhat.   Social Determinants of Health   Financial Resource Strain:   . Difficulty of Paying Living Expenses: Not on file  Food Insecurity:   . Worried About Charity fundraiser in the Last Year: Not on file  . Ran Out of Food in the Last Year: Not on file  Transportation Needs:   . Lack of Transportation (Medical): Not on file  . Lack of Transportation (Non-Medical): Not on file  Physical Activity:   . Days of Exercise per Week: Not on file  . Minutes of Exercise per Session: Not on file  Stress:   . Feeling of Stress : Not on file  Social Connections:   . Frequency of Communication with Friends and Family: Not on file  . Frequency of Social Gatherings with Friends and Family: Not on file  . Attends Religious Services: Not on file  . Active Member of Clubs or Organizations: Not on file  . Attends Archivist Meetings: Not on file  . Marital Status: Not on file  Intimate Partner Violence:   . Fear of Current or Ex-Partner: Not on file  . Emotionally Abused: Not on file  . Physically Abused: Not on file  . Sexually Abused: Not on file   Family History  Problem Relation Age of Onset  . Diabetes Mother   . Heart disease Mother        CHF  . Pancreatitis Mother   . Alcohol abuse Mother 42       due to chronic illness of a daughter  . Heart disease Daughter   . Dementia Paternal Grandmother        from head injury   Past Surgical History:  Procedure Laterality Date  . [place]   After delivery of baby--sounds like retained placenta  . TUBAL LIGATION  1994     Vanessa Kick, MD 08/23/20 1349

## 2020-08-23 NOTE — ED Triage Notes (Signed)
Pt reports back pain that radiates  Down Lt left started on Sat . Pt reports having a hard time getting out of bed and walking. Pt reports she stands on hard concrete floors .

## 2020-11-10 ENCOUNTER — Other Ambulatory Visit: Payer: 59

## 2020-11-10 DIAGNOSIS — Z20822 Contact with and (suspected) exposure to covid-19: Secondary | ICD-10-CM

## 2020-11-12 LAB — NOVEL CORONAVIRUS, NAA: SARS-CoV-2, NAA: NOT DETECTED

## 2020-11-12 LAB — SARS-COV-2, NAA 2 DAY TAT

## 2020-11-12 LAB — SPECIMEN STATUS REPORT

## 2021-08-01 ENCOUNTER — Other Ambulatory Visit: Payer: Self-pay | Admitting: Family Medicine

## 2021-08-01 DIAGNOSIS — E041 Nontoxic single thyroid nodule: Secondary | ICD-10-CM

## 2021-08-03 ENCOUNTER — Ambulatory Visit
Admission: RE | Admit: 2021-08-03 | Discharge: 2021-08-03 | Disposition: A | Discharge status: Home / Self Care | Payer: PRIVATE HEALTH INSURANCE | Source: Ambulatory Visit | Attending: Family Medicine | Admitting: Family Medicine

## 2021-08-03 DIAGNOSIS — E041 Nontoxic single thyroid nodule: Secondary | ICD-10-CM

## 2021-08-17 ENCOUNTER — Other Ambulatory Visit: Payer: Self-pay | Admitting: Family Medicine

## 2021-08-17 DIAGNOSIS — E041 Nontoxic single thyroid nodule: Secondary | ICD-10-CM

## 2021-09-14 ENCOUNTER — Ambulatory Visit
Admission: RE | Admit: 2021-09-14 | Discharge: 2021-09-14 | Disposition: A | Discharge status: Home / Self Care | Payer: PRIVATE HEALTH INSURANCE | Source: Ambulatory Visit | Attending: Family Medicine | Admitting: Family Medicine

## 2021-09-14 ENCOUNTER — Other Ambulatory Visit (HOSPITAL_COMMUNITY)
Admission: RE | Admit: 2021-09-14 | Discharge: 2021-09-14 | Disposition: A | Discharge status: Home / Self Care | Payer: No Typology Code available for payment source | Source: Ambulatory Visit | Attending: Family Medicine | Admitting: Family Medicine

## 2021-09-14 DIAGNOSIS — E041 Nontoxic single thyroid nodule: Secondary | ICD-10-CM

## 2021-09-15 LAB — CYTOLOGY - NON PAP

## 2021-10-17 ENCOUNTER — Encounter (HOSPITAL_COMMUNITY): Payer: Self-pay

## 2021-11-29 DIAGNOSIS — E042 Nontoxic multinodular goiter: Secondary | ICD-10-CM | POA: Diagnosis not present

## 2021-12-12 ENCOUNTER — Other Ambulatory Visit: Payer: Self-pay

## 2021-12-12 ENCOUNTER — Encounter (HOSPITAL_BASED_OUTPATIENT_CLINIC_OR_DEPARTMENT_OTHER): Payer: Self-pay

## 2021-12-12 ENCOUNTER — Emergency Department (HOSPITAL_BASED_OUTPATIENT_CLINIC_OR_DEPARTMENT_OTHER)
Admission: EM | Admit: 2021-12-12 | Discharge: 2021-12-12 | Disposition: A | Discharge status: Home / Self Care | Payer: BC Managed Care – PPO | Attending: Emergency Medicine | Admitting: Emergency Medicine

## 2021-12-12 DIAGNOSIS — R42 Dizziness and giddiness: Secondary | ICD-10-CM | POA: Insufficient documentation

## 2021-12-12 HISTORY — DX: Disorder of thyroid, unspecified: E07.9

## 2021-12-12 LAB — CBC WITH DIFFERENTIAL/PLATELET
Abs Immature Granulocytes: 0.01 10*3/uL (ref 0.00–0.07)
Basophils Absolute: 0 10*3/uL (ref 0.0–0.1)
Basophils Relative: 0 %
Eosinophils Absolute: 0.1 10*3/uL (ref 0.0–0.5)
Eosinophils Relative: 1 %
HCT: 44 % (ref 36.0–46.0)
Hemoglobin: 15.1 g/dL — ABNORMAL HIGH (ref 12.0–15.0)
Immature Granulocytes: 0 %
Lymphocytes Relative: 28 %
Lymphs Abs: 1.2 10*3/uL (ref 0.7–4.0)
MCH: 30.3 pg (ref 26.0–34.0)
MCHC: 34.3 g/dL (ref 30.0–36.0)
MCV: 88.4 fL (ref 80.0–100.0)
Monocytes Absolute: 0.3 10*3/uL (ref 0.1–1.0)
Monocytes Relative: 8 %
Neutro Abs: 2.7 10*3/uL (ref 1.7–7.7)
Neutrophils Relative %: 63 %
Platelets: 215 10*3/uL (ref 150–400)
RBC: 4.98 MIL/uL (ref 3.87–5.11)
RDW: 12.4 % (ref 11.5–15.5)
WBC: 4.4 10*3/uL (ref 4.0–10.5)
nRBC: 0 % (ref 0.0–0.2)

## 2021-12-12 LAB — BASIC METABOLIC PANEL
Anion gap: 7 (ref 5–15)
BUN: 13 mg/dL (ref 6–20)
CO2: 26 mmol/L (ref 22–32)
Calcium: 9.2 mg/dL (ref 8.9–10.3)
Chloride: 105 mmol/L (ref 98–111)
Creatinine, Ser: 0.79 mg/dL (ref 0.44–1.00)
GFR, Estimated: >60 mL/min (ref >60)
Glucose, Bld: 106 mg/dL — ABNORMAL HIGH (ref 70–99)
Potassium: 3.7 mmol/L (ref 3.5–5.1)
Sodium: 138 mmol/L (ref 135–145)

## 2021-12-12 NOTE — ED Triage Notes (Addendum)
Pt c/o dizziness/lightheaded started ~930pm at work-denies pain-NAD-to triage in w/c

## 2021-12-12 NOTE — ED Provider Notes (Signed)
[facility] EMERGENCY DEPARTMENT Provider Note   CSN: [id number] Arrival date & time: 12/12/21  1146     History  Chief Complaint  Patient presents with   Dizziness   Rachel Baxter is a 56 y.o. female.  Presented to the emergency room with concern for episode of dizziness/lightheadedness.  States that she had an episode of dizziness/lightheadedness that started approximately 9:30 AM at work.  Lasted around 30 minutes or so and then seem to subside.  Since coming to ER it has completely resolved and she has had no ongoing symptoms.  She did not have any episodes of passing out.  Did feel like the room was spinning at times.  Seems to be worse with movement and improved with rest.  She denies prior history of vertigo.  No ear pain.  No fevers or chills.  No associated numbness, weakness, speech or vision change.  No associated chest pain or difficulty in breathing.  HPI     Home Medications Prior to Admission medications   Medication Sig Start Date End Date Taking? Authorizing Provider  cyclobenzaprine (FLEXERIL) 10 MG tablet Take 0.5 tablets (5 mg total) by mouth at bedtime. 06/29/20   Loura Halt A, NP  ibuprofen (ADVIL) 600 MG tablet Take 1 tablet (600 mg total) by mouth every 8 (eight) hours as needed for moderate pain. 08/23/20   Vanessa Kick, MD  predniSONE (DELTASONE) 20 MG tablet Take 2 tablets (40 mg total) by mouth daily. 08/23/20   Vanessa Kick, MD  vitamin B-12 (CYANOCOBALAMIN) 500 MCG tablet Take 500 mcg by mouth daily.    [provider]      Allergies    Erythromycin    Review of Systems   Review of Systems  Constitutional:  Negative for chills and fever.  HENT:  Negative for ear pain and sore throat.   Eyes:  Negative for pain and visual disturbance.  Respiratory:  Negative for cough and shortness of breath.   Cardiovascular:  Negative for chest pain and palpitations.  Gastrointestinal:  Negative for abdominal pain and vomiting.   Genitourinary:  Negative for dysuria and hematuria.  Musculoskeletal:  Negative for arthralgias and back pain.  Skin:  Negative for color change and rash.  Neurological:  Positive for dizziness. Negative for seizures and syncope.  All other systems reviewed and are negative.  Physical Exam Updated Vital Signs BP (!) 149/96    Pulse 77    Temp 98 F (36.7 C) (Oral)    Resp 18    Ht 5\' 7"  (1.702 m)    Wt 108.9 kg    SpO2 100%    BMI 37.59 kg/m  Physical Exam Vitals and nursing note reviewed.  Constitutional:      General: She is not in acute distress.    Appearance: She is well-developed.  HENT:     Head: Normocephalic and atraumatic.  Eyes:     Conjunctiva/sclera: Conjunctivae normal.  Cardiovascular:     Rate and Rhythm: Normal rate and regular rhythm.     Heart sounds: No murmur heard. Pulmonary:     Effort: Pulmonary effort is normal. No respiratory distress.     Breath sounds: Normal breath sounds.  Abdominal:     Palpations: Abdomen is soft.     Tenderness: There is no abdominal tenderness.  Musculoskeletal:        General: No swelling.     Cervical back: Neck supple.  Skin:    General: Skin is warm and dry.  Capillary Refill: Capillary refill takes less than 2 seconds.  Neurological:     Mental Status: She is alert.     Comments: AAOx3 CN 2-12 intact, speech clear visual fields intact 5/5 strength in b/l UE and LE Sensation to light touch intact in b/l UE and LE Normal FNF Normal gait  Psychiatric:        Mood and Affect: Mood normal.    ED Results / Procedures / Treatments   Labs (all labs ordered are listed, but only abnormal results are displayed) Labs Reviewed  CBC WITH DIFFERENTIAL/PLATELET - Abnormal; Notable for the following components:      Result Value   Hemoglobin 15.1 (*)    All other components within normal limits  BASIC METABOLIC PANEL - Abnormal; Notable for the following components:   Glucose, Bld 106 (*)    All other components  within normal limits    EKG EKG Interpretation  Date/Time:  Monday December 12 2021 12:35:58 EST Ventricular Rate:  75 PR Interval:  131 QRS Duration: 113 QT Interval:  399 QTC Calculation: 446 R Axis:   -39 Text Interpretation: Sinus rhythm Borderline IVCD with LAD Confirmed by Madalyn Rob [phone]) on 12/12/2021 12:45:10 PM  Radiology No results found.  Procedures Procedures    Medications Ordered in ED Medications - No data to display  ED Course/ Medical Decision Making/ A&P Clinical Course as of 12/13/21 1287  Mon Dec 12, 2021  1356 EKG 12-Lead [MH]    Clinical Course User Index [MH] Lawerance Bach                           Medical Decision Making Amount and/or Complexity of Data Reviewed Labs: ordered.   56 year old lady presenting to the emergency room with concern for dizziness/lightheadedness episode on physical exam patient appears well in no acute distress.  She has no associated neurologic complaints and has no focal neurologic deficit on neuro exam currently.  Her basic lab work was obtained and reviewed, no anemia or electrolyte derangement.  EKG demonstrating sinus rhythm.  Cardiac monitor reviewed also demonstrating sinus rhythm with normal heart rate in the 70s.  Suspect most likely BPPV.  Given her symptoms have resolved, she has no neurodeficits, ambulatory without difficulty, have very low suspicion for central etiology such as stroke at this time do not feel she needs any CT head or MRI head imaging at this time.  Recommend that she follow-up with her primary care doctor and return to ER as needed for any worsening symptoms.    After the discussed management above, the patient was determined to be safe for discharge.  The patient was in agreement with this plan and all questions regarding their care were answered.  ED return precautions were discussed and the patient will return to the ED with any significant worsening of  condition.         Final Clinical Impression(s) / ED Diagnoses Final diagnoses:  Dizziness    Rx / DC Orders ED Discharge Orders     None         Lucrezia Starch, MD 12/13/21 [phone]

## 2021-12-12 NOTE — Discharge Instructions (Signed)
Follow-up with your primary care doctor.  Come back to ER if you develop worsening dizziness, any episodes of passing out or other new concerning symptom.

## 2022-01-24 IMAGING — US US THYROID
1 series · 12 of 25 positions shown · non-contrast
Comparison: None.

CLINICAL DATA: Palpable abnormality. 55-year-old female with
history of right thyroid nodule.

EXAM:
THYROID ULTRASOUND
TECHNIQUE: Ultrasound examination of the thyroid gland and adjacent soft
tissues was performed.

[Series 1: us thyroid · 0.08mm/px · 64 acquisitions, 12 frames shown]
[im 3/64]
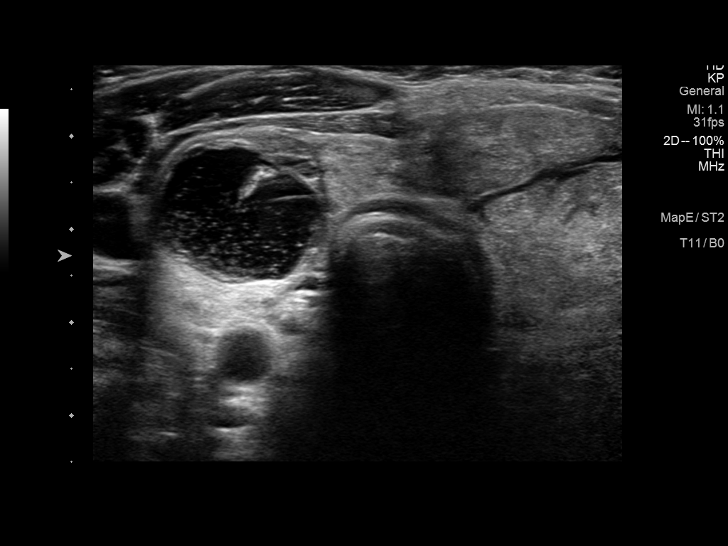
[im 8/64]
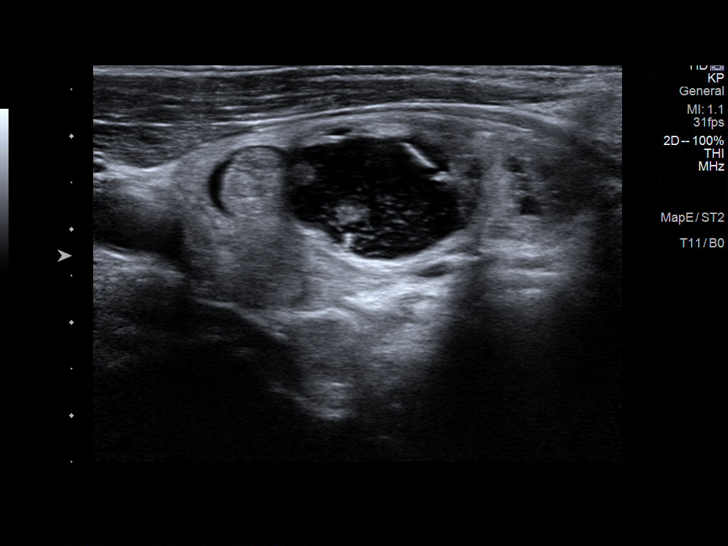
[im 14/64]
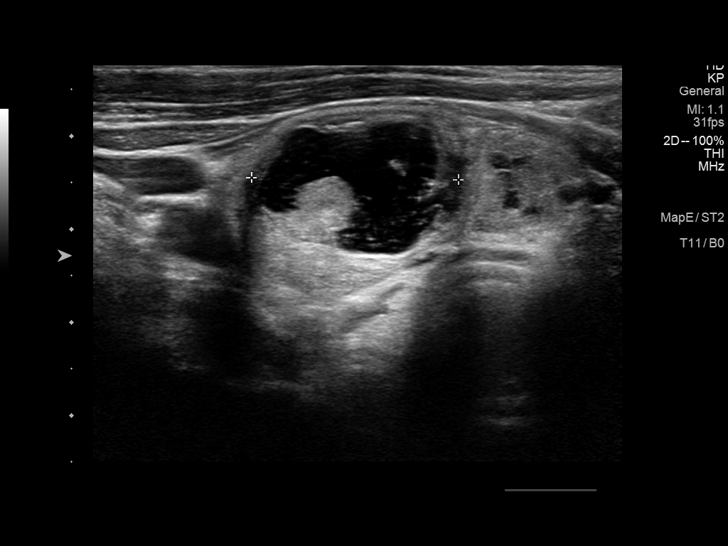
[im 19/64]
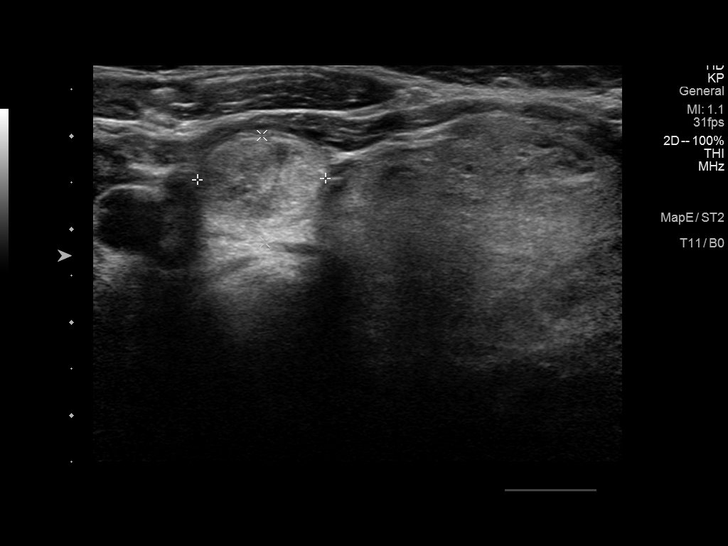
[im 24/64]
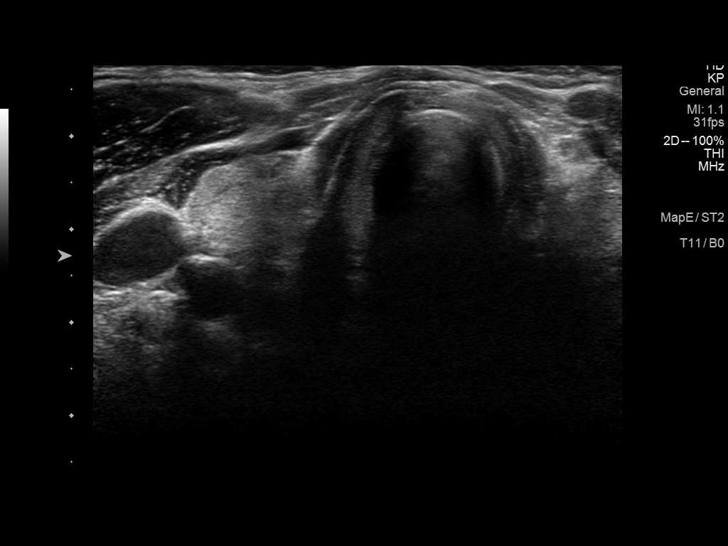
[im 29/64]
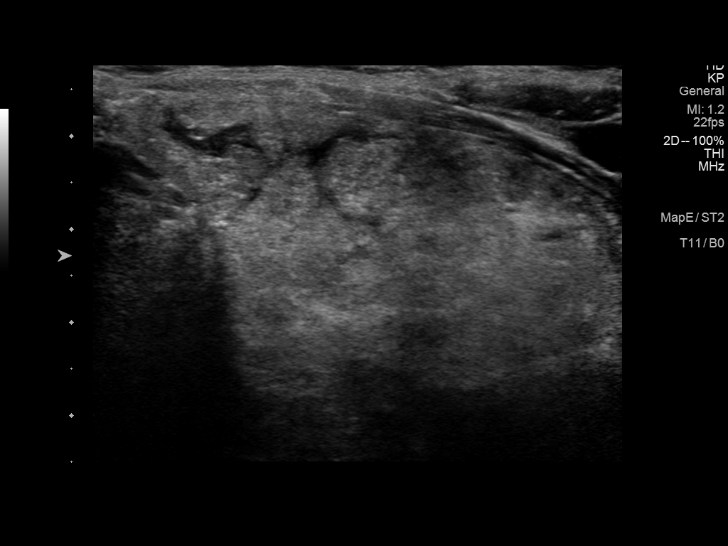
[im 35/64]
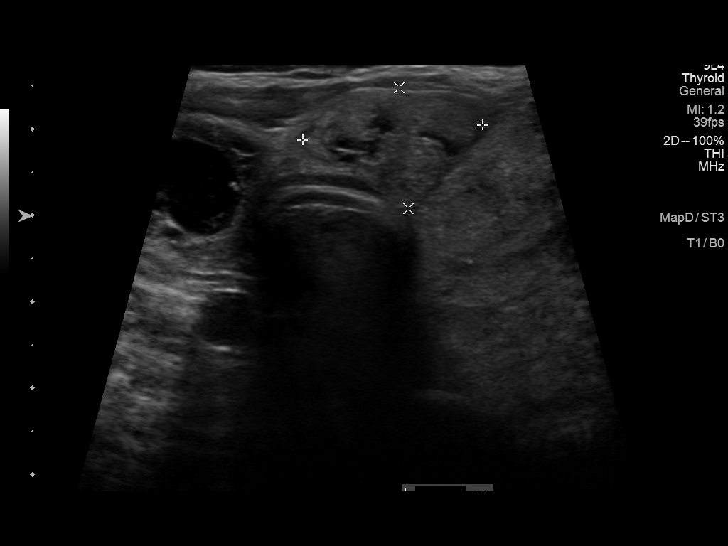
[im 40/64]
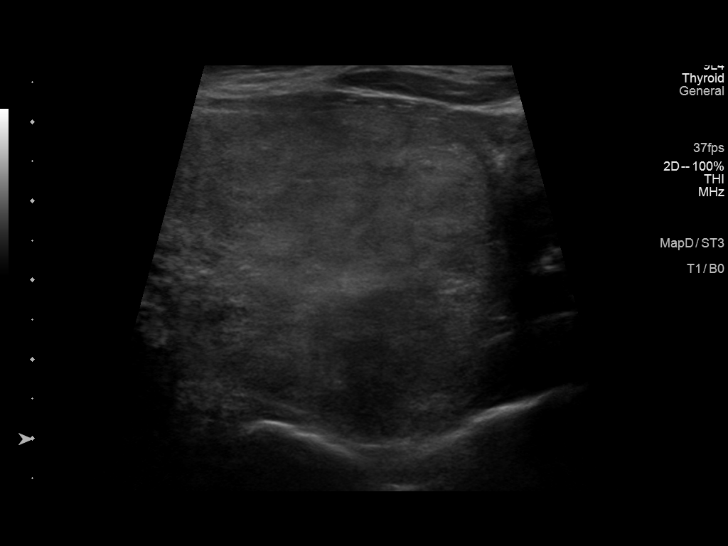
[im 45/64]
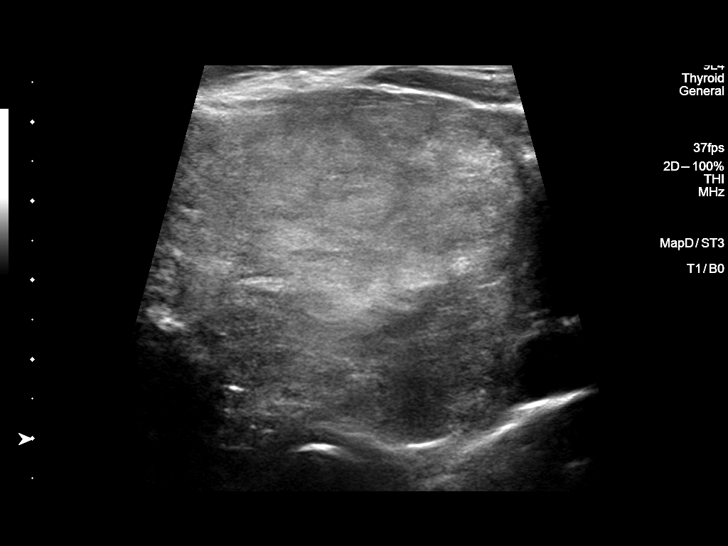
[im 50/64]
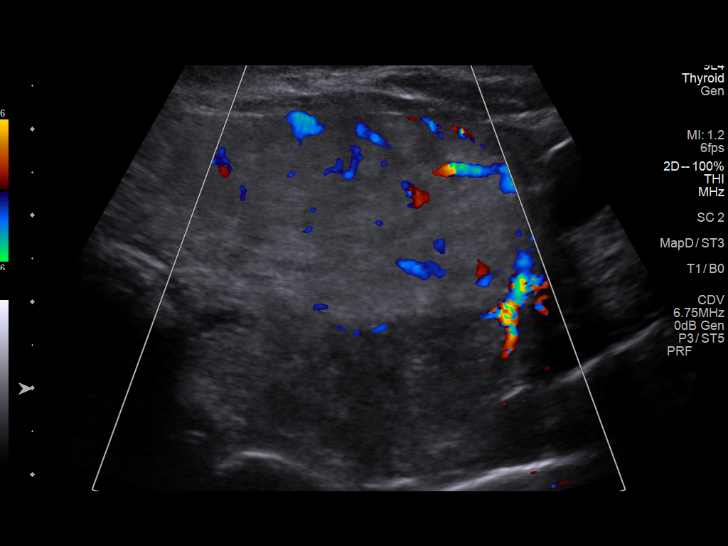
[im 56/64]
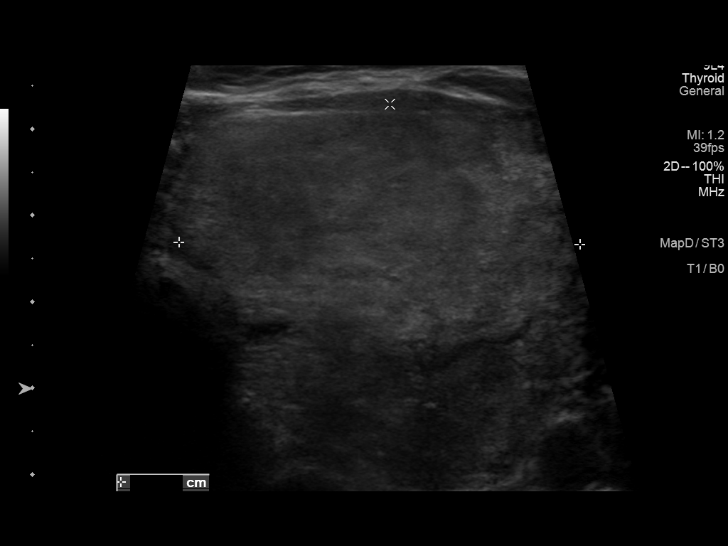
[im 61/64]
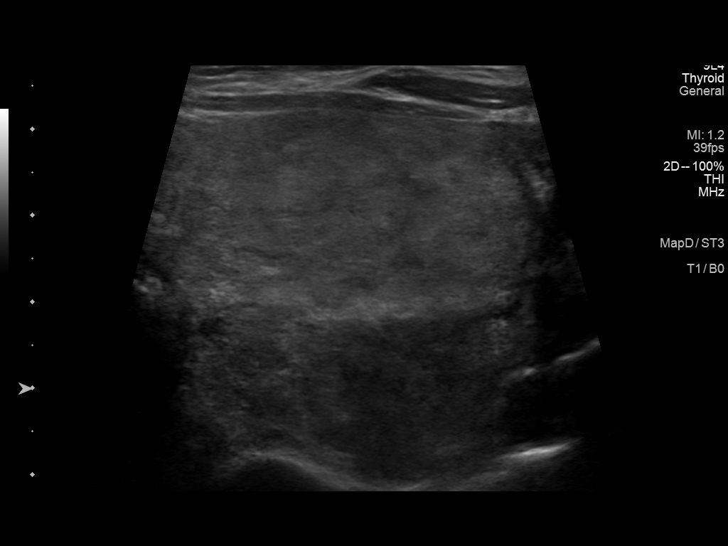

[12 of 25 positions shown; findings below may reference images not displayed]

FINDINGS: Parenchymal Echotexture: Moderately heterogenous

Isthmus: 0.7 cm

Right lobe: 4.8 x 2.1 x 2.5 cm

Left lobe: 7.4 x 4.4 x 5.2 cm

_________________________________________________________

Estimated total number of nodules >/= 1 cm: 6-10

Number of spongiform nodules >/=  2 cm not described below (TR1): 0

Number of mixed cystic and solid nodules >/= 1.5 cm not described
below (TR2): 0

_________________________________________________________

Nodule # 1:

Location: Right; Superior

Maximum size: 1.2 cm; Other 2 dimensions: 0.9 x 1.0 cm

Composition: solid/almost completely solid (2)

Echogenicity: isoechoic (1)

Shape: not taller-than-wide (0)

Margins: smooth (0)

Echogenic foci: none (0)

ACR TI-RADS total points: 3.

ACR TI-RADS risk category: TR3 (3 points).

ACR TI-RADS recommendations:

Given size (<1.4 cm) and appearance, this nodule does NOT meet
TI-RADS criteria for biopsy or dedicated follow-up.

_________________________________________________________

Nodule # 2:

Location: Right; Mid

Maximum size: 2.2 cm; Other 2 dimensions: 2.2 x 1.5 cm

Composition: mixed cystic and solid (1)

Echogenicity: hypoechoic (2)

Shape: not taller-than-wide (0)

Margins: smooth (0)

Echogenic foci: none (0)

ACR TI-RADS total points: 3.

ACR TI-RADS risk category: TR3 (3 points).

ACR TI-RADS recommendations:

*Given size (>/= 1.5 - 2.4 cm) and appearance, a follow-up
ultrasound in 1 year should be considered based on TI-RADS criteria.

_________________________________________________________

Nodule # 3:

Location: Right; Inferior

Maximum size: 1.4 cm; Other 2 dimensions: 1.3 x 1.1 cm

Composition: solid/almost completely solid (2)

Echogenicity: isoechoic (1)

Shape: not taller-than-wide (0)

Margins: ill-defined (0)

Echogenic foci: none (0)

ACR TI-RADS total points: 3.

ACR TI-RADS risk category: TR3 (3 points).

ACR TI-RADS recommendations:

Given size (<1.5 cm) and appearance, this nodule does NOT meet
TI-RADS criteria for biopsy or dedicated follow-up.

_________________________________________________________

Nodule # 4:

Location: Left; Superior

Maximum size: 2.1 cm; Other 2 dimensions: 1.9 x 1.3 cm

Composition: spongiform (0)

Echogenicity: isoechoic (1)

Shape: not taller-than-wide (0)

Margins: ill-defined (0)

Echogenic foci: none (0)

ACR TI-RADS total points: 1.

ACR TI-RADS risk category: TR1 (0-1 points).

ACR TI-RADS recommendations:

This nodule does NOT meet TI-RADS criteria for biopsy or dedicated
follow-up.

_________________________________________________________

Nodule # 5:

Location: Left; Mid

Maximum size: 5.1 cm; Other 2 dimensions: 4.6 x 4.6 cm

Composition: solid/almost completely solid (2)

Echogenicity: isoechoic (1)

Shape: not taller-than-wide (0)

Margins: smooth (0)

Echogenic foci:

ACR TI-RADS total points: 3.

ACR TI-RADS risk category: TR3 (3 points).

ACR TI-RADS recommendations:

**Given size (>/= 2.5 cm) and appearance, fine needle aspiration of
this mildly suspicious nodule should be considered based on TI-RADS
criteria.

_________________________________________________________

Nodule # 6:

Location: Left; Inferior

Maximum size: 2.4 cm; Other 2 dimensions: 1.9 x 1.8 cm

Composition: solid/almost completely solid (2)

Echogenicity: hypoechoic (2)

Shape: not taller-than-wide (0)

Margins: ill-defined (0)

Echogenic foci: none (0)

ACR TI-RADS total points: 4.

ACR TI-RADS risk category: TR4 (4-6 points).

ACR TI-RADS recommendations:

**Given size (>/= 1.5 cm) and appearance, fine needle aspiration of
this moderately suspicious nodule should be considered based on
TI-RADS criteria.

_________________________________________________________
IMPRESSION: 1. Multinodular goiter.
2. Solid nodules in the right mid (labeled 5, 5.1 cm) and left
inferior (labeled 6, 2.4 cm) meet criteria (TI-RADS category 3 and
TI-RADS category 4, respectively) meet criteria for tissue sampling.
Recommend ultrasound-guided fine-needle aspiration of both nodules.
3. Solid-cystic thyroid nodule in the right mid thyroid (labeled 2,
2.2 cm) meets criteria (TI-RADS category 3) for 1 year ultrasound
surveillance.
4. The remaining visualized bilateral thyroid nodules appear benign
and do not meet criteria for dedicated ultrasound follow-up or
tissue sampling.

The above is in keeping with the ACR TI-RADS recommendations - [HOSPITAL] 7263;[DATE].

## 2022-03-07 IMAGING — US US FNA BIOPSY THYROID 1ST LESION
1 series · 13 of 25 positions shown · non-contrast
Comparison: US 08/03/21

MEDICATIONS:
5 cc 1% lidocaine

COMPLICATIONS:
None immediate.

INDICATION: Indeterminate thyroid nodule

Left mid lobe thyroid nodule
5.1 cm
Limited US done before exam shows left mid lobe thyroid nodule
One nodule--- had been measured as two--- verified with Dr Stice
One nodule- left mid lobe thyroid 5.1 cm
EXAM:
ULTRASOUND GUIDED FINE NEEDLE ASPIRATION OF INDETERMINATE THYROID
NODULE
TECHNIQUE: Informed written consent was obtained from the patient after a
discussion of the risks, benefits and alternatives to treatment.
Questions regarding the procedure were encouraged and answered. A
timeout was performed prior to the initiation of the procedure.

[Series 1: us fna biopsy thyroid 1st lesion · 0.10mm/px · 31 acquisitions, 13 frames shown]
[im 1/31]
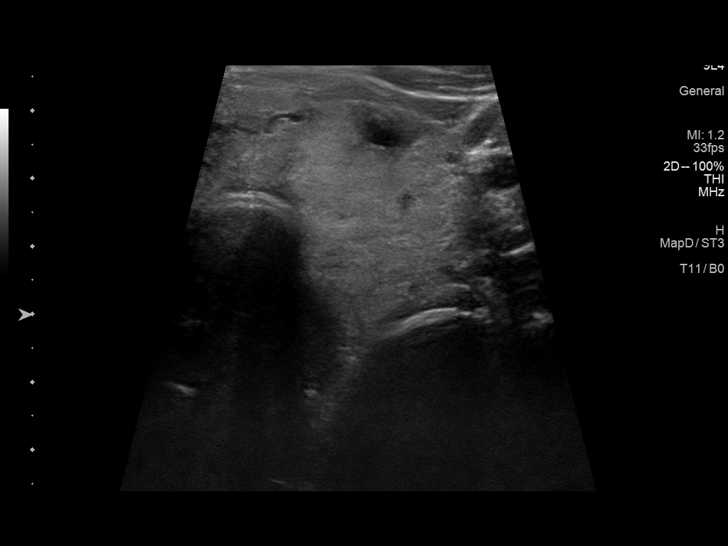
[im 3/31]
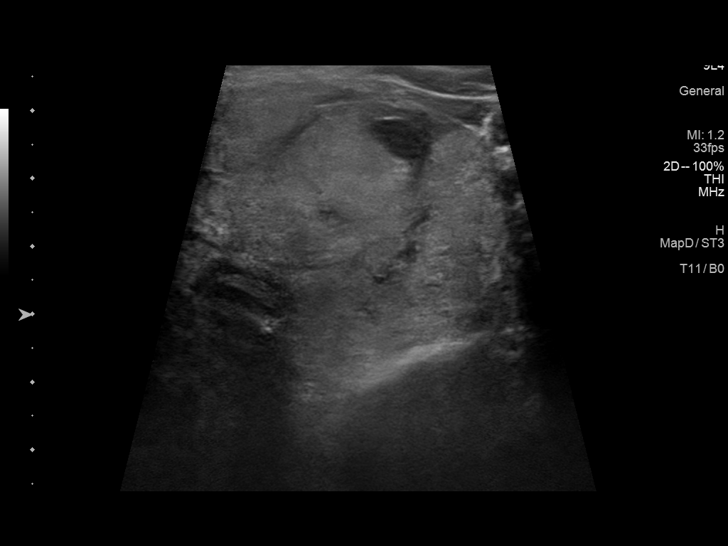
[im 6/31]
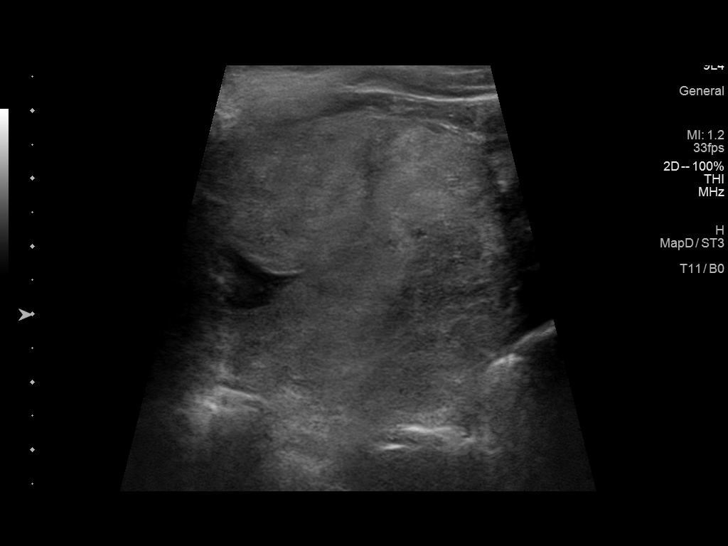
[im 8/31]
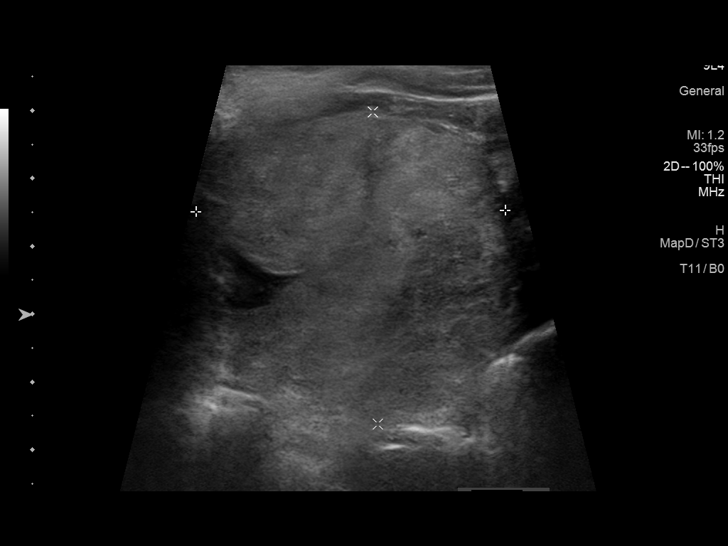
[im 11/31]
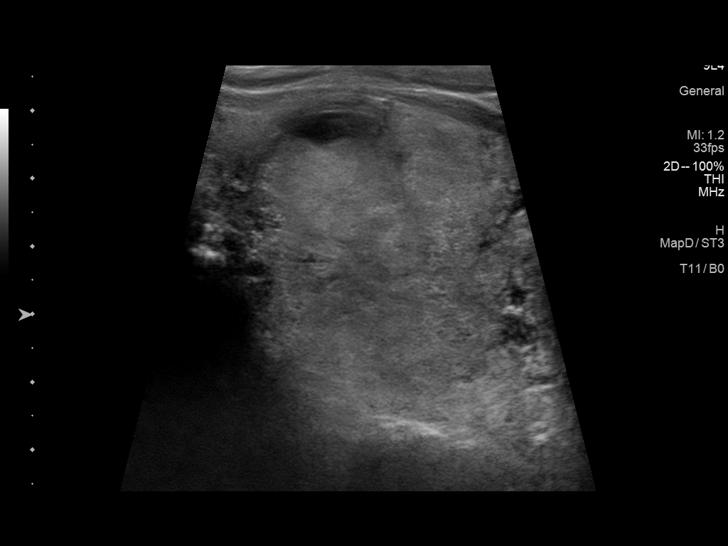
[im 13/31]
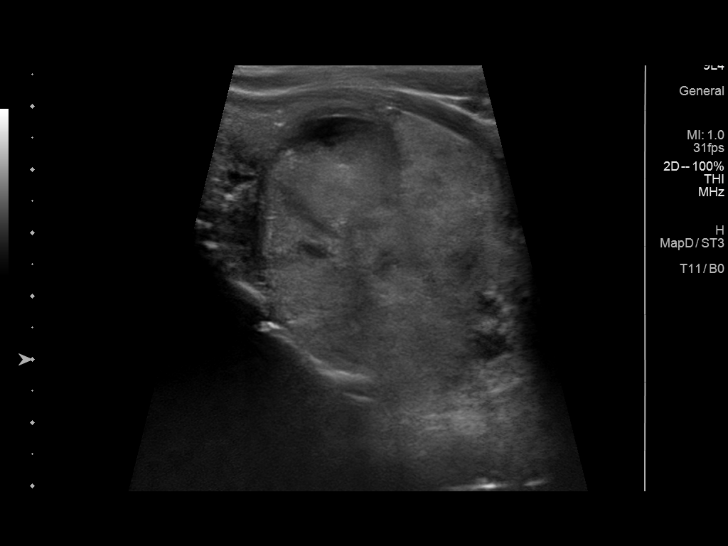
[im 16/31]
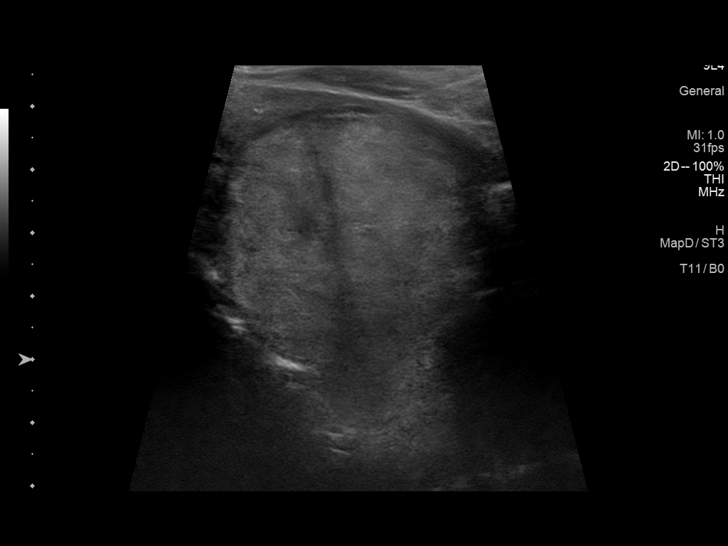
[im 18/31]
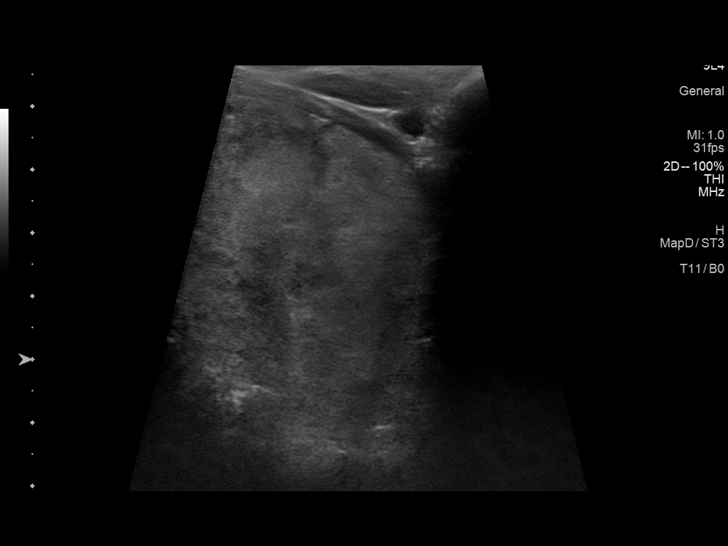
[im 21/31]
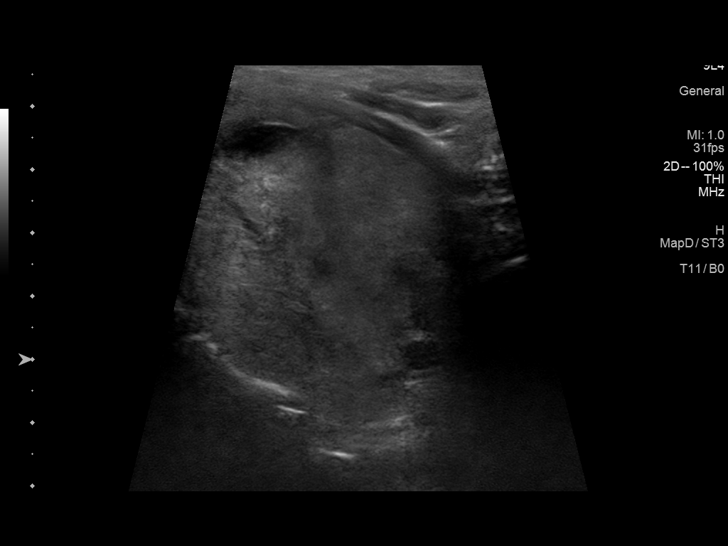
[im 23/31]
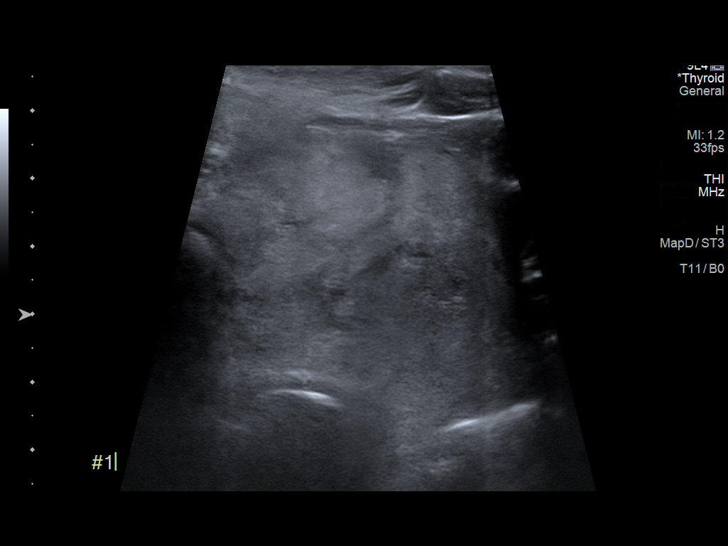
[im 26/31]
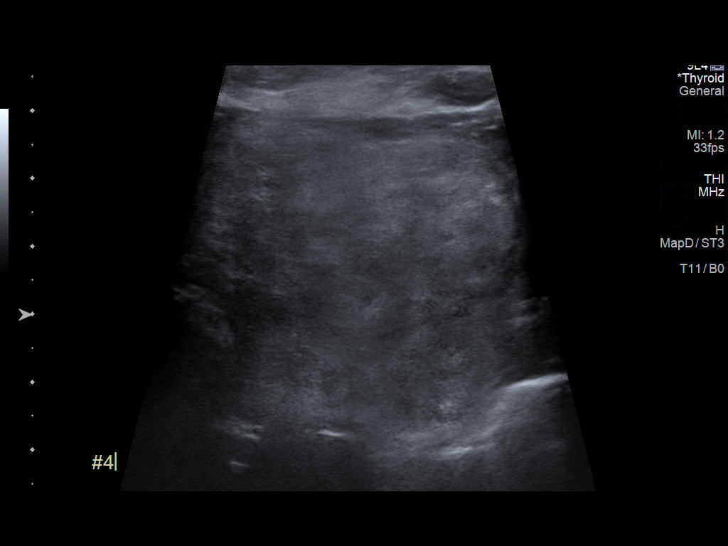
[im 28/31]
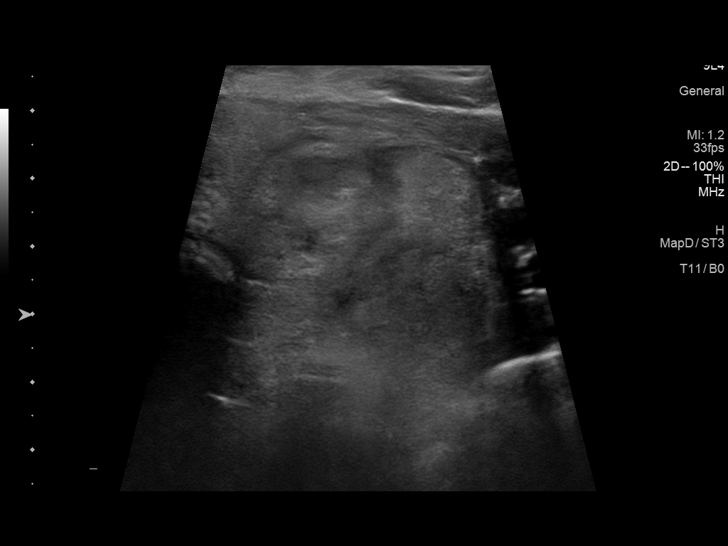
[im 31/31]
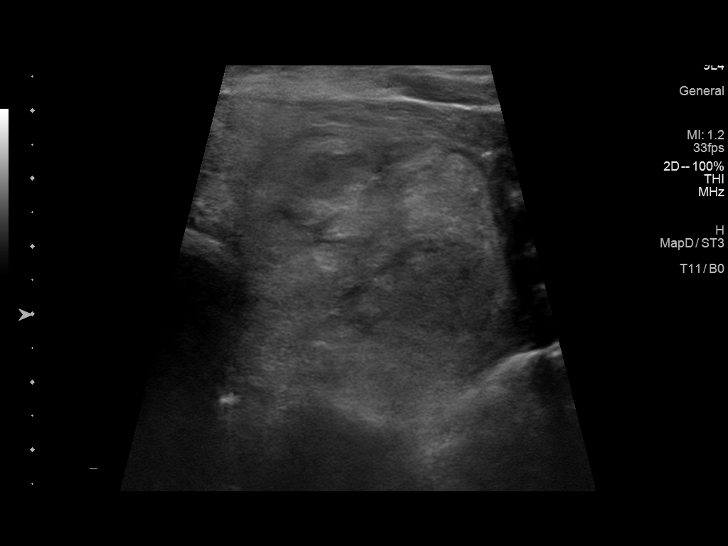

[13 of 25 positions shown; findings below may reference images not displayed]

Pre-procedural ultrasound scanning demonstrated unchanged size and
appearance of the indeterminate nodule within the left thyroid

The procedure was planned. The neck was prepped in the usual sterile
fashion, and a sterile drape was applied covering the operative
field. A timeout was performed prior to the initiation of the
procedure. Local anesthesia was provided with 1% lidocaine.

Under direct ultrasound guidance, 5 FNA biopsies were performed of
the left mid lobe thyroid nodule with a 27 gauge needle.

2 of these samples were obtained for AFIRMA

Multiple ultrasound images were saved for procedural documentation
purposes. The samples were prepared and submitted to pathology.

Limited post procedural scanning was negative for hematoma or
additional complication. Dressings were placed. The patient
tolerated the above procedures procedure well without immediate
postprocedural complication.
FINDINGS: Nodule reference number based on prior diagnostic ultrasound: 5

Maximum size: 5.1 cm

Location: Left; Mid

ACR TI-RADS risk category: TR3 (3 points)

Reason for biopsy: meets ACR TI-RADS criteria

Ultrasound imaging confirms appropriate placement of the needles
within the thyroid nodule.
IMPRESSION: Technically successful ultrasound guided fine needle aspiration of
left mid lobe thyroid nodule

Read by

Mohd Suffian Dhan
